# Patient Record
Sex: Male | Born: 2005 | Race: White | Hispanic: No | Marital: Single | Smoking: Never smoker
Health system: Southern US, Community
[De-identification: ages and names within clinical notes are randomized; demographics above are authoritative.]

## PROBLEM LIST (undated history)

## (undated) DIAGNOSIS — S060X9A Concussion with loss of consciousness of unspecified duration, initial encounter: Secondary | ICD-10-CM

## (undated) DIAGNOSIS — S060XAA Concussion with loss of consciousness status unknown, initial encounter: Secondary | ICD-10-CM

## (undated) DIAGNOSIS — J45909 Unspecified asthma, uncomplicated: Secondary | ICD-10-CM

## (undated) DIAGNOSIS — J020 Streptococcal pharyngitis: Secondary | ICD-10-CM

## (undated) HISTORY — PX: CIRCUMCISION: SHX1350

---
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
## 2023-01-22 DIAGNOSIS — H1045 Other chronic allergic conjunctivitis: Secondary | ICD-10-CM | POA: Diagnosis not present

## 2023-01-22 DIAGNOSIS — H5203 Hypermetropia, bilateral: Secondary | ICD-10-CM | POA: Diagnosis not present

## 2023-02-08 DIAGNOSIS — Z8279 Family history of other congenital malformations, deformations and chromosomal abnormalities: Secondary | ICD-10-CM | POA: Diagnosis not present

## 2023-02-08 DIAGNOSIS — Z0110 Encounter for examination of ears and hearing without abnormal findings: Secondary | ICD-10-CM | POA: Diagnosis not present

## 2023-02-08 DIAGNOSIS — R519 Headache, unspecified: Secondary | ICD-10-CM | POA: Diagnosis not present

## 2023-02-08 DIAGNOSIS — H9193 Unspecified hearing loss, bilateral: Secondary | ICD-10-CM | POA: Diagnosis not present

## 2023-02-08 DIAGNOSIS — R42 Dizziness and giddiness: Secondary | ICD-10-CM | POA: Diagnosis not present

## 2023-03-06 DIAGNOSIS — J069 Acute upper respiratory infection, unspecified: Secondary | ICD-10-CM | POA: Diagnosis not present

## 2023-03-06 DIAGNOSIS — R070 Pain in throat: Secondary | ICD-10-CM | POA: Diagnosis not present

## 2023-04-27 DIAGNOSIS — J019 Acute sinusitis, unspecified: Secondary | ICD-10-CM | POA: Diagnosis not present

## 2023-06-15 DIAGNOSIS — L239 Allergic contact dermatitis, unspecified cause: Secondary | ICD-10-CM | POA: Diagnosis not present

## 2023-06-15 DIAGNOSIS — L42 Pityriasis rosea: Secondary | ICD-10-CM | POA: Diagnosis not present

## 2023-12-06 DIAGNOSIS — R059 Cough, unspecified: Secondary | ICD-10-CM | POA: Diagnosis not present

## 2023-12-22 DIAGNOSIS — S93492A Sprain of other ligament of left ankle, initial encounter: Secondary | ICD-10-CM | POA: Diagnosis not present

## 2023-12-22 DIAGNOSIS — W1789XA Other fall from one level to another, initial encounter: Secondary | ICD-10-CM | POA: Diagnosis not present

## 2023-12-22 DIAGNOSIS — S93402A Sprain of unspecified ligament of left ankle, initial encounter: Secondary | ICD-10-CM | POA: Diagnosis not present

## 2023-12-22 DIAGNOSIS — M25572 Pain in left ankle and joints of left foot: Secondary | ICD-10-CM | POA: Diagnosis not present

## 2023-12-22 DIAGNOSIS — M25579 Pain in unspecified ankle and joints of unspecified foot: Secondary | ICD-10-CM | POA: Diagnosis not present

## 2023-12-27 DIAGNOSIS — S93402A Sprain of unspecified ligament of left ankle, initial encounter: Secondary | ICD-10-CM | POA: Diagnosis not present

## 2023-12-27 DIAGNOSIS — S99912A Unspecified injury of left ankle, initial encounter: Secondary | ICD-10-CM | POA: Diagnosis not present

## 2024-01-02 DIAGNOSIS — R519 Headache, unspecified: Secondary | ICD-10-CM | POA: Diagnosis not present

## 2024-01-02 DIAGNOSIS — G43109 Migraine with aura, not intractable, without status migrainosus: Secondary | ICD-10-CM | POA: Diagnosis not present

## 2024-01-02 DIAGNOSIS — H53143 Visual discomfort, bilateral: Secondary | ICD-10-CM | POA: Diagnosis not present

## 2024-01-03 DIAGNOSIS — J4530 Mild persistent asthma, uncomplicated: Secondary | ICD-10-CM | POA: Diagnosis not present

## 2024-01-03 DIAGNOSIS — J3081 Allergic rhinitis due to animal (cat) (dog) hair and dander: Secondary | ICD-10-CM | POA: Diagnosis not present

## 2024-01-03 DIAGNOSIS — J3089 Other allergic rhinitis: Secondary | ICD-10-CM | POA: Diagnosis not present

## 2024-01-03 DIAGNOSIS — J301 Allergic rhinitis due to pollen: Secondary | ICD-10-CM | POA: Diagnosis not present

## 2024-01-24 DIAGNOSIS — H0014 Chalazion left upper eyelid: Secondary | ICD-10-CM | POA: Diagnosis not present

## 2024-01-25 DIAGNOSIS — Z0000 Encounter for general adult medical examination without abnormal findings: Secondary | ICD-10-CM | POA: Diagnosis not present

## 2024-02-28 DIAGNOSIS — R6889 Other general symptoms and signs: Secondary | ICD-10-CM | POA: Diagnosis not present

## 2024-02-28 DIAGNOSIS — J45909 Unspecified asthma, uncomplicated: Secondary | ICD-10-CM | POA: Diagnosis not present

## 2024-02-28 DIAGNOSIS — R0981 Nasal congestion: Secondary | ICD-10-CM | POA: Diagnosis not present

## 2024-02-28 DIAGNOSIS — J069 Acute upper respiratory infection, unspecified: Secondary | ICD-10-CM | POA: Diagnosis not present

## 2024-03-31 DIAGNOSIS — M25572 Pain in left ankle and joints of left foot: Secondary | ICD-10-CM | POA: Diagnosis not present

## 2024-03-31 DIAGNOSIS — M25372 Other instability, left ankle: Secondary | ICD-10-CM | POA: Diagnosis not present

## 2024-04-07 DIAGNOSIS — M25572 Pain in left ankle and joints of left foot: Secondary | ICD-10-CM | POA: Diagnosis not present

## 2024-04-07 DIAGNOSIS — M25372 Other instability, left ankle: Secondary | ICD-10-CM | POA: Diagnosis not present

## 2024-04-28 DIAGNOSIS — M25572 Pain in left ankle and joints of left foot: Secondary | ICD-10-CM | POA: Diagnosis not present

## 2024-04-28 DIAGNOSIS — M25372 Other instability, left ankle: Secondary | ICD-10-CM | POA: Diagnosis not present
